# Patient Record
Sex: Female | Born: 1995
Health system: Southern US, Community
[De-identification: ages and names within clinical notes are randomized; demographics above are authoritative.]

## PROBLEM LIST (undated history)

## (undated) DIAGNOSIS — F32A Depression, unspecified: Secondary | ICD-10-CM

## (undated) DIAGNOSIS — G2581 Restless legs syndrome: Secondary | ICD-10-CM

## (undated) DIAGNOSIS — F419 Anxiety disorder, unspecified: Secondary | ICD-10-CM

## (undated) DIAGNOSIS — N200 Calculus of kidney: Secondary | ICD-10-CM

## (undated) HISTORY — PX: NO PAST SURGERIES: SHX2092

---

## 2019-03-17 ENCOUNTER — Other Ambulatory Visit: Payer: Self-pay

## 2019-03-17 ENCOUNTER — Emergency Department (HOSPITAL_COMMUNITY)
Admission: EM | Admit: 2019-03-17 | Discharge: 2019-03-18 | Disposition: A | Discharge status: Home / Self Care | Payer: Medicaid - Out of State | Attending: Emergency Medicine | Admitting: Emergency Medicine

## 2019-03-17 ENCOUNTER — Encounter (HOSPITAL_COMMUNITY): Payer: Self-pay | Admitting: *Deleted

## 2019-03-17 DIAGNOSIS — R109 Unspecified abdominal pain: Secondary | ICD-10-CM | POA: Diagnosis present

## 2019-03-17 DIAGNOSIS — N201 Calculus of ureter: Secondary | ICD-10-CM | POA: Diagnosis not present

## 2019-03-17 DIAGNOSIS — F17210 Nicotine dependence, cigarettes, uncomplicated: Secondary | ICD-10-CM | POA: Diagnosis not present

## 2019-03-17 HISTORY — DX: Calculus of kidney: N20.0

## 2019-03-17 NOTE — ED Triage Notes (Signed)
Pt c/o flank pain radiating into L groin; hx of kidney stones, reports pain feels similar

## 2019-03-18 ENCOUNTER — Emergency Department (HOSPITAL_COMMUNITY): Payer: Medicaid - Out of State

## 2019-03-18 LAB — URINALYSIS, ROUTINE W REFLEX MICROSCOPIC
Glucose, UA: NEGATIVE mg/dL
Ketones, ur: 40 mg/dL — AB
Leukocytes,Ua: NEGATIVE
Nitrite: NEGATIVE
Protein, ur: 30 mg/dL — AB
Specific Gravity, Urine: >1.03 — ABNORMAL HIGH (ref 1.005–1.030)
pH: 5.5 (ref 5.0–8.0)

## 2019-03-18 LAB — URINALYSIS, MICROSCOPIC (REFLEX)

## 2019-03-18 LAB — CBC WITH DIFFERENTIAL/PLATELET
Abs Immature Granulocytes: 0.02 10*3/uL (ref 0.00–0.07)
Basophils Absolute: 0.1 10*3/uL (ref 0.0–0.1)
Basophils Relative: 1 %
Eosinophils Absolute: 0.1 10*3/uL (ref 0.0–0.5)
Eosinophils Relative: 1 %
HCT: 38.4 % (ref 36.0–46.0)
Hemoglobin: 12.7 g/dL (ref 12.0–15.0)
Immature Granulocytes: 0 %
Lymphocytes Relative: 22 %
Lymphs Abs: 2.7 10*3/uL (ref 0.7–4.0)
MCH: 29.1 pg (ref 26.0–34.0)
MCHC: 33.1 g/dL (ref 30.0–36.0)
MCV: 87.9 fL (ref 80.0–100.0)
Monocytes Absolute: 1.1 10*3/uL — ABNORMAL HIGH (ref 0.1–1.0)
Monocytes Relative: 9 %
Neutro Abs: 8.4 10*3/uL — ABNORMAL HIGH (ref 1.7–7.7)
Neutrophils Relative %: 67 %
Platelets: 338 10*3/uL (ref 150–400)
RBC: 4.37 MIL/uL (ref 3.87–5.11)
RDW: 12.4 % (ref 11.5–15.5)
WBC: 12.3 10*3/uL — ABNORMAL HIGH (ref 4.0–10.5)
nRBC: 0 % (ref 0.0–0.2)

## 2019-03-18 LAB — BASIC METABOLIC PANEL
Anion gap: 10 (ref 5–15)
BUN: 5 mg/dL — ABNORMAL LOW (ref 6–20)
CO2: 22 mmol/L (ref 22–32)
Calcium: 9 mg/dL (ref 8.9–10.3)
Chloride: 105 mmol/L (ref 98–111)
Creatinine, Ser: 0.91 mg/dL (ref 0.44–1.00)
GFR calc Af Amer: >60 mL/min (ref >60)
GFR calc non Af Amer: >60 mL/min (ref >60)
Glucose, Bld: 97 mg/dL (ref 70–99)
Potassium: 3.4 mmol/L — ABNORMAL LOW (ref 3.5–5.1)
Sodium: 137 mmol/L (ref 135–145)

## 2019-03-18 LAB — I-STAT BETA HCG BLOOD, ED (MC, WL, AP ONLY): I-stat hCG, quantitative: <5 m[IU]/mL (ref <5)

## 2019-03-18 MED ORDER — KETOROLAC TROMETHAMINE 30 MG/ML IJ SOLN
30.0000 mg | Freq: Once | INTRAMUSCULAR | Status: AC
  Administered 2019-03-18: 01:00:00 30 mg via INTRAVENOUS
  Filled 2019-03-18: qty 1

## 2019-03-18 MED ORDER — HYDROMORPHONE HCL 1 MG/ML IJ SOLN
1.0000 mg | Freq: Once | INTRAMUSCULAR | Status: AC
  Administered 2019-03-18: 01:00:00 1 mg via INTRAVENOUS
  Filled 2019-03-18: qty 1

## 2019-03-18 MED ORDER — HYDROMORPHONE HCL 1 MG/ML IJ SOLN
1.0000 mg | Freq: Once | INTRAMUSCULAR | Status: AC
  Administered 2019-03-18: 1 mg via INTRAVENOUS
  Filled 2019-03-18: qty 1

## 2019-03-18 MED ORDER — ONDANSETRON 4 MG PO TBDP: 4.0000 mg | ORAL_TABLET | Freq: Three times a day (TID) | ORAL | 0 refills | Status: DC | PRN

## 2019-03-18 MED ORDER — ONDANSETRON HCL 4 MG/2ML IJ SOLN
4.0000 mg | Freq: Once | INTRAMUSCULAR | Status: AC
  Administered 2019-03-18: 4 mg via INTRAVENOUS
  Filled 2019-03-18: qty 2

## 2019-03-18 MED ORDER — HYDROCODONE-ACETAMINOPHEN 5-325 MG PO TABS: 1.0000 | ORAL_TABLET | ORAL | 0 refills | Status: DC | PRN

## 2019-03-18 NOTE — ED Provider Notes (Signed)
[facility] Provider Note   CSN: [id number] Arrival date & time: 03/17/19  2328     History   Chief Complaint Chief Complaint  Patient presents with  . Nephrolithiasis    HPI Rebecca Singleton is a 23 y.o. female.     The history is provided by the patient and medical records.     23 y.o. female with history of kidney stones, presenting to the ED with left flank pain.  States onset today around 7 PM.  Pain localized to left lateral abdomen with some radiation to the groin.  She does report difficulty urinating and "pressure" when doing so.  She denies any dysuria or hematuria.  No pelvic pain or vaginal discharge.  No fever or chills.  She has had some nausea but denies vomiting.  Reports history of kidney stones in the past, have always been on her left side.  She has required lithotripsy in the past but other stones have passed spontaneously.  She did try taking 800 mg Motrin at home without relief.  [place] a urologist there that she sees regularly.  Past Medical History:  Diagnosis Date  . Kidney stones     There are no active problems to display for this patient.   History reviewed. No pertinent surgical history.   OB History   No obstetric history on file.      Home Medications    Prior to Admission medications   Not on File    Family History No family history on file.  Social History Social History   Tobacco Use  . Smoking status: Current Every Day Smoker  . Smokeless tobacco: Never Used  Substance Use Topics  . Alcohol use: Never    Frequency: Never  . Drug use: Never     Allergies   Latex and Morphine and related   Review of Systems Review of Systems  Gastrointestinal: Positive for nausea.  Genitourinary: Positive for flank pain.  All other systems reviewed and are negative.    Physical Exam Updated Vital Signs BP 140/88 (BP Location: Right Arm)    Pulse 99   Temp 97.9 F (36.6 C) (Oral)   Resp 18   LMP 03/07/2019   SpO2 98%   Physical Exam Vitals signs and nursing note reviewed.  Constitutional:      Appearance: She is well-developed.  HENT:     Head: Normocephalic and atraumatic.  Eyes:     Conjunctiva/sclera: Conjunctivae normal.     Pupils: Pupils are equal, round, and reactive to light.  Neck:     Musculoskeletal: Normal range of motion.  Cardiovascular:     Rate and Rhythm: Normal rate and regular rhythm.     Heart sounds: Normal heart sounds.  Pulmonary:     Effort: Pulmonary effort is normal.     Breath sounds: Normal breath sounds.  Abdominal:     General: Bowel sounds are normal.     Palpations: Abdomen is soft.     Tenderness: There is abdominal tenderness. There is no guarding.    Musculoskeletal: Normal range of motion.  Skin:    General: Skin is warm and dry.  Neurological:     Mental Status: She is alert and oriented to person, place, and time.      ED Treatments / Results  Labs (all labs ordered are listed, but only abnormal results are displayed) Labs Reviewed  CBC WITH DIFFERENTIAL/PLATELET - Abnormal; Notable  for the following components:      Result Value   WBC 12.3 (*)    Neutro Abs 8.4 (*)    Monocytes Absolute 1.1 (*)    All other components within normal limits  BASIC METABOLIC PANEL - Abnormal; Notable for the following components:   Potassium 3.4 (*)    BUN 5 (*)    All other components within normal limits  URINALYSIS, ROUTINE W REFLEX MICROSCOPIC - Abnormal; Notable for the following components:   Specific Gravity, Urine >1.030 (*)    Hgb urine dipstick SMALL (*)    Bilirubin Urine SMALL (*)    Ketones, ur 40 (*)    Protein, ur 30 (*)    All other components within normal limits  URINALYSIS, MICROSCOPIC (REFLEX) - Abnormal; Notable for the following components:   Bacteria, UA FEW (*)    All other components within normal limits  I-STAT BETA HCG BLOOD, ED ([facility], [facility], [facility]  ONLY)    EKG    Radiology Ct Renal Stone Study  Result Date: 03/18/2019 CLINICAL DATA:  Left flank pain EXAM: CT ABDOMEN AND PELVIS WITHOUT CONTRAST TECHNIQUE: Multidetector CT imaging of the abdomen and pelvis was performed following the standard protocol without IV contrast. COMPARISON:  None. FINDINGS: Lower chest: Lung bases are clear. No effusions. Heart is normal size. Hepatobiliary: No focal hepatic abnormality. Gallbladder unremarkable. Pancreas: No focal abnormality or ductal dilatation. Spleen: No focal abnormality.  Normal size. Adrenals/Urinary Tract: There is mild left hydronephrosis. Punctate 1 mm left UVJ stone present. Adrenal glands and urinary bladder unremarkable. Stomach/Bowel: Normal appendix. Stomach, large and small bowel grossly unremarkable. Vascular/Lymphatic: No evidence of aneurysm or adenopathy. Reproductive: Uterus and adnexa unremarkable.  No mass. Other: No free fluid or free air. Musculoskeletal: No acute bony abnormality. IMPRESSION: Punctate 1 mm left UVJ stone with mild left hydronephrosis. Electronically Signed   By: Charlett NoseKevin  Dover M.D.   On: 03/18/2019 01:17    Procedures Procedures (including critical care time)  Medications Ordered in ED Medications  HYDROmorphone (DILAUDID) injection 1 mg (1 mg Intravenous Given 03/18/19 0042)  ondansetron (ZOFRAN) injection 4 mg (4 mg Intravenous Given 03/18/19 0042)  ketorolac (TORADOL) 30 MG/ML injection 30 mg (30 mg Intravenous Given 03/18/19 0042)  HYDROmorphone (DILAUDID) injection 1 mg (1 mg Intravenous Given 03/18/19 0210)     Initial Impression / Assessment and Plan / ED Course  I have reviewed the triage vital signs and the nursing notes.  Pertinent labs & imaging results that were available during my care of the patient were reviewed by me and considered in my medical decision making (see chart for details).  23 year old female here with left flank pain.  Has history of kidney stones and reports this feels  similar.  She is afebrile and nontoxic.  Has left lateral abdominal tenderness without peritoneal signs.  Screening labs are overall reassuring.  UA without signs of infection.  Renal study does reveal punctate 1 mm left UVJ stone with mild left hydronephrosis.  Pain was controlled here without issue.  Patient appears stable for discharge home with outpatient management.  Stone likely to pass in the next 24 to 48 hours.  [place], can follow-up with her urologist there.  Return here for any new or acute changes.  Final Clinical Impressions(s) / ED Diagnoses   Final diagnoses:  Left ureteral stone    ED Discharge Orders         Ordered  HYDROcodone-acetaminophen (NORCO/VICODIN) 5-325 MG tablet  Every 4 hours PRN     03/18/19 0307    ondansetron (ZOFRAN ODT) 4 MG disintegrating tablet  Every 8 hours PRN     03/18/19 0307           Larene Pickett, PA-C 03/18/19 0330    Orpah Greek, MD 03/18/19 [id number]

## 2019-03-18 NOTE — ED Notes (Signed)
Pt discharged from ED; instructions provided and scripts given; Pt encouraged to return to ED if symptoms worsen and to f/u with PCP; Pt verbalized understanding of all instructions 

## 2019-03-18 NOTE — Discharge Instructions (Signed)
Follow-up with your urologist once you return home to [facility]. Can use the prescribed medication to keep symptoms controlled.

## 2019-12-15 ENCOUNTER — Ambulatory Visit: Payer: Medicaid - Out of State | Admitting: Family Medicine

## 2019-12-23 ENCOUNTER — Ambulatory Visit: Payer: Medicaid - Out of State | Admitting: Primary Care

## 2020-03-18 ENCOUNTER — Encounter: Payer: Self-pay | Admitting: Emergency Medicine

## 2020-03-18 ENCOUNTER — Other Ambulatory Visit: Payer: Self-pay

## 2020-03-18 ENCOUNTER — Ambulatory Visit
Admission: EM | Admit: 2020-03-18 | Discharge: 2020-03-18 | Disposition: A | Discharge status: Home / Self Care | Payer: 59 | Attending: Emergency Medicine | Admitting: Emergency Medicine

## 2020-03-18 DIAGNOSIS — N390 Urinary tract infection, site not specified: Secondary | ICD-10-CM | POA: Diagnosis present

## 2020-03-18 DIAGNOSIS — N898 Other specified noninflammatory disorders of vagina: Secondary | ICD-10-CM | POA: Diagnosis present

## 2020-03-18 HISTORY — DX: Depression, unspecified: F32.A

## 2020-03-18 HISTORY — DX: Restless legs syndrome: G25.81

## 2020-03-18 HISTORY — DX: Anxiety disorder, unspecified: F41.9

## 2020-03-18 LAB — POCT URINALYSIS DIP (MANUAL ENTRY)
Bilirubin, UA: NEGATIVE
Glucose, UA: NEGATIVE mg/dL
Ketones, POC UA: NEGATIVE mg/dL
Nitrite, UA: POSITIVE — AB
Protein Ur, POC: NEGATIVE mg/dL
Spec Grav, UA: 1.025 (ref 1.010–1.025)
Urobilinogen, UA: 0.2 E.U./dL
pH, UA: 7 (ref 5.0–8.0)

## 2020-03-18 MED ORDER — FLUCONAZOLE 150 MG PO TABS: 150.0000 mg | ORAL_TABLET | Freq: Every day | ORAL | 0 refills | Status: DC

## 2020-03-18 MED ORDER — CEPHALEXIN 500 MG PO CAPS: 500.0000 mg | ORAL_CAPSULE | Freq: Two times a day (BID) | ORAL | 0 refills | Status: AC

## 2020-03-18 NOTE — ED Triage Notes (Signed)
Patient in today c/o urinary frequency and dysuria x 3-4 days. Patient does have vaginal discharge and itching. Patient has tried OTC yeast infection medication without relief.

## 2020-03-18 NOTE — ED Provider Notes (Signed)
Rebecca Singleton    CSN: [id number] Arrival date & time: 03/18/20  1651      History   Chief Complaint Chief Complaint  Patient presents with  . Urinary Frequency  . Dysuria    HPI Rebecca Singleton is a 24 y.o. female.   Patient presents with 3 to 4-day history of urinary frequency and dysuria.  She also reports thick white vaginal discharge and vaginal itching.  She denies fever, chills, abdominal pain, back pain, pelvic pain, rash, lesions, or other symptoms.  Treatment attempted at home with OTC vaginal yeast suppository.  She is sexually active in a monogamous relationship.    The history is provided by the patient.    Past Medical History:  Diagnosis Date  . Anxiety   . Depression   . Kidney stones   . Restless leg syndrome     There are no problems to display for this patient.   Past Surgical History:  Procedure Laterality Date  . NO PAST SURGERIES      OB History   No obstetric history on file.      Home Medications    Prior to Admission medications   Medication Sig Start Date End Date Taking? Authorizing Provider  pramipexole (MIRAPEX) 0.5 MG tablet Take 0.75 mg by mouth daily. 12/31/18  Yes [provider]  sertraline (ZOLOFT) 50 MG tablet Take 50 mg by mouth daily. 12/31/18  Yes [provider]  cephALEXin (KEFLEX) 500 MG capsule Take 1 capsule (500 mg total) by mouth 2 (two) times daily for 5 days. 03/18/20 03/23/20  Mickie Bail, NP  fluconazole (DIFLUCAN) 150 MG tablet Take 1 tablet (150 mg total) by mouth daily. Take one tablet today.  May repeat in 3 days. 03/18/20   Mickie Bail, NP  HYDROcodone-acetaminophen (NORCO/VICODIN) 5-325 MG tablet Take 1 tablet by mouth every 4 (four) hours as needed. 03/18/19   Garlon Hatchet, PA-C  omeprazole (PRILOSEC) 20 MG capsule Take 20 mg by mouth daily. 01/08/19   [provider]  ondansetron (ZOFRAN ODT) 4 MG disintegrating tablet Take 1 tablet (4 mg total) by mouth every 8  (eight) hours as needed for nausea. 03/18/19   Garlon Hatchet, PA-C    Family History Family History  Problem Relation Age of Onset  . Fibromyalgia Mother   . Iron deficiency Mother   . Healthy Father     Social History Social History   Tobacco Use  . Smoking status: Former Smoker    Packs/day: 1.00    Years: 6.00    Pack years: 6.00    Types: Cigarettes    Quit date: 03/18/2016    Years since quitting: 4.0  . Smokeless tobacco: Never Used  Vaping Use  . Vaping Use: Every day  . Substances: Nicotine, Flavoring  Substance Use Topics  . Alcohol use: Yes    Comment: rarely  . Drug use: Never     Allergies   Morphine, Morphine and related, and Latex   Review of Systems Review of Systems  Constitutional: Negative for chills and fever.  HENT: Negative for ear pain and sore throat.   Eyes: Negative for pain and visual disturbance.  Respiratory: Negative for cough and shortness of breath.   Cardiovascular: Negative for chest pain and palpitations.  Gastrointestinal: Negative for abdominal pain, nausea and vomiting.  Genitourinary: Positive for dysuria and vaginal discharge. Negative for flank pain, hematuria and pelvic pain.  Musculoskeletal: Negative for arthralgias and back pain.  Skin: Negative for color change and rash.  Neurological: Negative for seizures and syncope.  All other systems reviewed and are negative.    Physical Exam Triage Vital Signs ED Triage Vitals  Enc Vitals Group     BP      Pulse      Resp      Temp      Temp src      SpO2      Weight      Height      Head Circumference      Peak Flow      Pain Score      Pain Loc      Pain Edu?      Excl. in [place]?    No data found.  Updated Vital Signs BP 108/73 (BP Location: Left Arm)   Pulse 86   Temp 98.6 F (37 C) (Oral)   Resp 18   Ht 5\' 6"  (1.676 m)   Wt 220 lb (99.8 kg)   LMP 03/05/2020 (Exact Date)   SpO2 97%   BMI 35.51 kg/m   Visual Acuity Right Eye Distance:   Left Eye  Distance:   Bilateral Distance:    Right Eye Near:   Left Eye Near:    Bilateral Near:     Physical Exam Vitals and nursing note reviewed.  Constitutional:      General: She is not in acute distress.    Appearance: She is well-developed.  HENT:     Head: Normocephalic and atraumatic.     Mouth/Throat:     Mouth: Mucous membranes are moist.  Eyes:     Conjunctiva/sclera: Conjunctivae normal.  Cardiovascular:     Rate and Rhythm: Normal rate and regular rhythm.     Heart sounds: No murmur heard.   Pulmonary:     Effort: Pulmonary effort is normal. No respiratory distress.     Breath sounds: Normal breath sounds.  Abdominal:     Palpations: Abdomen is soft.     Tenderness: There is no abdominal tenderness. There is no right CVA tenderness, left CVA tenderness, guarding or rebound.  Musculoskeletal:     Cervical back: Neck supple.  Skin:    General: Skin is warm and dry.     Findings: No rash.  Neurological:     General: No focal deficit present.     Mental Status: She is alert and oriented to person, place, and time.     Gait: Gait normal.  Psychiatric:        Mood and Affect: Mood normal.        Behavior: Behavior normal.      UC Treatments / Results  Labs (all labs ordered are listed, but only abnormal results are displayed) Labs Reviewed  POCT URINALYSIS DIP (MANUAL ENTRY) - Abnormal; Notable for the following components:      Result Value   Clarity, UA cloudy (*)    Blood, UA small (*)    Nitrite, UA Positive (*)    Leukocytes, UA Moderate (2+) (*)    All other components within normal limits  URINE CULTURE  CERVICOVAGINAL ANCILLARY ONLY    EKG   Radiology No results found.  Procedures Procedures (including critical care time)  Medications Ordered in UC Medications - No data to display  Initial Impression / Assessment and Plan / UC Course  I have reviewed the triage vital signs and the nursing notes.  Pertinent labs & imaging results that  were available  during my care of the patient were reviewed by me and considered in my medical decision making (see chart for details).   UTI and vaginal discharge.  Urine culture pending.  Patient obtained vaginal self swab for STD testing.  Low suspicion for STDs; patient is in a long-term monogamous relationship and her symptoms indicate a candidal infection.  Instructed patient to abstain from sexual activity until her test results are back.  Discussed with patient that she may require additional treatment if her test results are positive.  Instructed her to follow-up with her PCP if her symptoms are not improving.  Patient agrees to plan of care.     Final Clinical Impressions(s) / UC Diagnoses   Final diagnoses:  Urinary tract infection without hematuria, site unspecified  Vaginal discharge     Discharge Instructions     Take the Keflex and Diflucan as directed.    Your urine shows signs of infection.  A urine culture is pending.  We will call you if your antibiotic needs to be changed.    Your vaginal tests are pending.  Do not have sexual activity until the test results are back.  You may require additional treatment at that time.    Follow up with your primary care provider if your symptoms are not improving.       ED Prescriptions    Medication Sig Dispense Auth. Provider   cephALEXin (KEFLEX) 500 MG capsule Take 1 capsule (500 mg total) by mouth 2 (two) times daily for 5 days. 10 capsule Mickie Bail, NP   fluconazole (DIFLUCAN) 150 MG tablet Take 1 tablet (150 mg total) by mouth daily. Take one tablet today.  May repeat in 3 days. 2 tablet Mickie Bail, NP     I have reviewed the PDMP during this encounter.   Mickie Bail, NP 03/18/20 1731

## 2020-03-18 NOTE — Discharge Instructions (Signed)
Take the Keflex and Diflucan as directed.    Your urine shows signs of infection.  A urine culture is pending.  We will call you if your antibiotic needs to be changed.    Your vaginal tests are pending.  Do not have sexual activity until the test results are back.  You may require additional treatment at that time.    Follow up with your primary care provider if your symptoms are not improving.

## 2020-03-21 LAB — URINE CULTURE: Culture: >=100000 — AB

## 2020-03-22 LAB — CERVICOVAGINAL ANCILLARY ONLY
Bacterial Vaginitis (gardnerella): POSITIVE — AB
Candida Glabrata: NEGATIVE
Candida Vaginitis: NEGATIVE
Chlamydia: NEGATIVE
Comment: NEGATIVE
Comment: NEGATIVE
Comment: NEGATIVE
Comment: NEGATIVE
Comment: NEGATIVE
Comment: NORMAL
Neisseria Gonorrhea: NEGATIVE
Trichomonas: NEGATIVE

## 2020-03-23 ENCOUNTER — Telehealth (HOSPITAL_COMMUNITY): Payer: Self-pay | Admitting: Orthopedic Surgery

## 2020-03-23 MED ORDER — METRONIDAZOLE 500 MG PO TABS: 500.0000 mg | ORAL_TABLET | Freq: Two times a day (BID) | ORAL | 0 refills | Status: DC

## 2020-03-25 MED ORDER — METRONIDAZOLE 500 MG PO TABS: 500.0000 mg | ORAL_TABLET | Freq: Two times a day (BID) | ORAL | 0 refills | Status: DC

## 2020-03-25 NOTE — Addendum Note (Signed)
Addended by: Latanya Presser on: 03/25/2020 11:26 AM   Modules accepted: Orders

## 2020-03-25 NOTE — Telephone Encounter (Signed)
Pt called asking about her rx. It was sent to a pharmacy in [place]. Pt needed it sent to [facility]. Apologized for the inconvenience and informed pt it would be sent to correct pharmacy.

## 2020-04-12 ENCOUNTER — Ambulatory Visit (INDEPENDENT_AMBULATORY_CARE_PROVIDER_SITE_OTHER): Payer: 59 | Admitting: Family Medicine

## 2020-04-12 ENCOUNTER — Encounter: Payer: Self-pay | Admitting: Family Medicine

## 2020-04-12 ENCOUNTER — Other Ambulatory Visit: Payer: Self-pay

## 2020-04-12 VITALS — BP 127/81 | HR 71 | Wt 241.6 lb

## 2020-04-12 DIAGNOSIS — F3341 Major depressive disorder, recurrent, in partial remission: Secondary | ICD-10-CM

## 2020-04-12 DIAGNOSIS — N921 Excessive and frequent menstruation with irregular cycle: Secondary | ICD-10-CM

## 2020-04-12 DIAGNOSIS — K219 Gastro-esophageal reflux disease without esophagitis: Secondary | ICD-10-CM | POA: Diagnosis not present

## 2020-04-12 DIAGNOSIS — N946 Dysmenorrhea, unspecified: Secondary | ICD-10-CM | POA: Insufficient documentation

## 2020-04-12 DIAGNOSIS — N920 Excessive and frequent menstruation with regular cycle: Secondary | ICD-10-CM | POA: Insufficient documentation

## 2020-04-12 MED ORDER — NORGESTIMATE-ETH ESTRADIOL 0.25-35 MG-MCG PO TABS: 1.0000 | ORAL_TABLET | Freq: Every day | ORAL | 6 refills | Status: DC

## 2020-04-12 NOTE — Assessment & Plan Note (Signed)
Likely becoming irregular based on weight gain. Will try OCs with skipping placebo for now.

## 2020-04-12 NOTE — Patient Instructions (Signed)
Dysmenorrhea  Dysmenorrhea refers to cramps caused by the muscles of the uterus tightening (contracting) during a menstrual period. Dysmenorrhea may be mild, or it may be severe enough to interfere with everyday activities for a few days each month. Primary dysmenorrhea is menstrual cramps that last a couple of days when you start having menstrual periods or soon after. This often begins after a teenager starts having her period. As a woman gets older or has a baby, the cramps will usually lessen or disappear. Secondary dysmenorrhea begins later in life and is caused by a disorder in the reproductive system. It lasts longer, and it may cause more pain than primary dysmenorrhea. The pain may start before the period and last a few days after the period. What are the causes? Dysmenorrhea is usually caused by an underlying problem, such as:  The tissue that lines the uterus (endometrium) growing outside of the uterus in other areas of the body (endometriosis).  Endometrial tissue growing into the muscular walls of the uterus (adenomyosis).  Blood vessels in the pelvis becoming filled with blood just before the menstrual period (pelvic congestive syndrome).  Overgrowth of cells (polyps) in the endometrium or the lower part of the uterus (cervix).  The uterus dropping down into the vagina (prolapse) due to stretched or weak muscles.  Bladder problems, such as infection or inflammation.  Intestinal problems, such as a tumor or irritable bowel syndrome.  Cancer of the reproductive organs or bladder.  A severely tipped uterus.  A cervix that is closed or has a very small opening.  Noncancerous (benign) tumors of the uterus (fibroids).  Pelvic inflammatory disease (PID).  Pelvic scarring (adhesions) from a previous surgery.  An ovarian cyst.  An IUD (intrauterine device). What increases the risk? You are more likely to develop this condition if:  You are younger than age 67.  You  started puberty early.  You have irregular or heavy bleeding.  You have never given birth.  You have a family history of dysmenorrhea.  You smoke. What are the signs or symptoms? Symptoms of this condition include:  Cramping, throbbing pain, or a feeling of fullness in the lower abdomen.  Lower back pain.  Periods lasting for longer than 7 days.  Headaches.  Bloating.  Fatigue.  Nausea or vomiting.  Diarrhea.  Sweating or dizziness.  Loose stools. How is this diagnosed? This condition may be diagnosed based on:  Your symptoms.  Your medical history.  A physical exam.  Blood tests.  A Pap test. This is a test in which cells from the cervix are tested for signs of cancer or infection.  A pregnancy test.  Imaging tests, such as: ? Ultrasound. ? A procedure to remove and examine a sample of endometrial tissue (dilation and curettage, D&C). ? A procedure to visually examine the inside of:  The uterus (hysteroscopy).  The abdomen or pelvis (laparoscopy).  The bladder (cystoscopy).  The intestine (colonoscopy).  The stomach (gastroscopy). ? X-rays. ? CT scan. ? MRI. How is this treated? Treatment depends on the cause of the dysmenorrhea. Treatment may include:  Pain medicine prescribed by your health care provider.  Birth control pills that contain the hormone progesterone.  An IUD that contains the hormone progesterone.  Medicines to control bleeding.  Hormone replacement therapy.  NSAIDs. These may help to stop the production of hormones that cause cramps.  Antidepressant medicines.  Surgery to remove adhesions, endometriosis, ovarian cysts, fibroids, or the entire uterus (hysterectomy).  Injections of progesterone  to stop the menstrual period.  A procedure to destroy the endometrium (endometrial ablation).  A procedure to cut the nerves in the bottom of the spine (sacrum) that go to the reproductive organs (presacral neurectomy).  A  procedure to apply an electric current to nerves in the sacrum (sacral nerve stimulation).  Exercise and physical therapy.  Meditation and yoga therapy.  Acupuncture. Work with your health care provider to determine what treatment or combination of treatments is best for you. Follow these instructions at home: Relieving pain and cramping  Apply heat to your lower back or abdomen when you experience pain or cramps. Use the heat source that your health care provider recommends, such as a moist heat pack or a heating pad. ? Place a towel between your skin and the heat source. ? Leave the heat on for 20-30 minutes. ? Remove the heat if your skin turns bright red. This is especially important if you are unable to feel pain, heat, or cold. You may have a greater risk of getting burned. ? Do not sleep with a heating pad on.  Do aerobic exercises, such as walking, swimming, or biking. This can help to relieve cramps.  Massage your lower back or abdomen to help relieve pain. General instructions  Take over-the-counter and prescription medicines only as told by your health care provider.  Do not drive or use heavy machinery while taking prescription pain medicine.  Avoid alcohol and caffeine during and right before your menstrual period. These can make cramps worse.  Do not use any products that contain nicotine or tobacco, such as cigarettes and e-cigarettes. If you need help quitting, ask your health care provider.  Keep all follow-up visits as told by your health care provider. This is important. Contact a health care provider if:  You have pain that gets worse or does not get better with medicine.  You have pain with sex.  You develop nausea or vomiting with your period that is not controlled with medicine. Get help right away if:  You faint. Summary  Dysmenorrhea refers to cramps caused by the muscles of the uterus tightening (contracting) during a menstrual  period.  Dysmenorrhea may be mild, or it may be severe enough to interfere with everyday activities for a few days each month.  Treatment depends on the cause of the dysmenorrhea.  Work with your health care provider to determine what treatment or combination of treatments is best for you. This information is not intended to replace advice given to you by your health care provider. Make sure you discuss any questions you have with your health care provider. Document Revised: 08/31/2017 Document Reviewed: 10/21/2016 Elsevier Patient Education  2020 [employer]  

## 2020-04-12 NOTE — Progress Notes (Signed)
Subjective:    Patient ID: Rebecca Singleton is a 24 y.o. female presenting with No chief complaint on file.  on 04/12/2020  HPI: Moved here to be with new partner from [place]. Oldest of 7. Youngest sibling in 15 yo. On Depo 2012-2013 previously and did not like it. Also with IUD,but caused on-going bleeding. Cycles are always very heavy, a lot of bleeding, has had to miss school and work due to pain. Usually regular and recently it is going longer. 20 lb weight gain in last 1 year. Saw OB/GYN and had hormones checked and were normal. Had external u/s only and told she might have small fibroids "too small to see". With her previous wife, attempted at home insemination, in hopes of pursuing fertility with ovulation prediction kits on several occasions and dd not achieve pregnancy.  Review of Systems  Constitutional: Negative for chills and fever.  Respiratory: Negative for shortness of breath.   Cardiovascular: Negative for chest pain.  Gastrointestinal: Negative for abdominal pain, nausea and vomiting.  Genitourinary: Negative for dysuria.  Skin: Negative for rash.      Objective:    BP 127/81    Pulse 71    Wt 241 lb 9.6 oz (109.6 kg)    LMP 03/05/2020    BMI 39.00 kg/m  Physical Exam Exam conducted with a chaperone present.  Constitutional:      General: She is not in acute distress.    Appearance: She is well-developed.  HENT:     Head: Normocephalic and atraumatic.  Eyes:     General: No scleral icterus. Cardiovascular:     Rate and Rhythm: Normal rate.  Pulmonary:     Effort: Pulmonary effort is normal.  Chest:     Breasts:        Right: No inverted nipple, mass or tenderness.        Left: Normal. No inverted nipple, mass or tenderness.  Abdominal:     Palpations: Abdomen is soft.  Genitourinary:    General: Normal vulva.     Comments: BUS normal, vagina is pink and rugated, cervix is nulliparous without lesion, uterus is small and anteverted,  no adnexal mass or tenderness.  Musculoskeletal:     Cervical back: Neck supple.  Skin:    General: Skin is warm and dry.  Neurological:     Mental Status: She is alert and oriented to person, place, and time.        Assessment & Plan:   Problem List Items Addressed This Visit      Unprioritized   Depression    On Zoloft      GERD (gastroesophageal reflux disease)    Continue Prilosec       Menorrhagia    Likely becoming irregular based on weight gain. Will try OCs with skipping placebo for now.      Dysmenorrhea - Primary    Begin continuous OC's for pain and cycle control. Discussed possibility of endometriosis and possible need for HSG and REI referral and possible IVF needs once ready to attempt pregnancy--not ready now. On PNV's.      Relevant Medications   norgestimate-ethinyl estradiol (ORTHO-CYCLEN) 0.25-35 MG-MCG tablet      Total time in review of prior notes, pathology, labs, history taking, review with patient, exam, note writing, discussion of options, plan for next steps, alternatives and risks of treatment: 32 minutes.  Return in about 1 year (around 04/12/2021) for in person, a CPE.  Shelbie Proctor  Shawnie Pons 04/12/2020 5:10 PM

## 2020-04-12 NOTE — Assessment & Plan Note (Signed)
On Zoloft. °

## 2020-04-12 NOTE — Assessment & Plan Note (Signed)
Continue Prilosec

## 2020-04-12 NOTE — Progress Notes (Signed)
Patient will like to discuss her heavy period. She reports cycles have always been abnormal. She reports last pap was in 2019 in [place].

## 2020-04-12 NOTE — Assessment & Plan Note (Signed)
Begin continuous OC's for pain and cycle control. Discussed possibility of endometriosis and possible need for HSG and REI referral and possible IVF needs once ready to attempt pregnancy--not ready now. On PNV's.

## 2020-05-01 IMAGING — CT CT RENAL STONE PROTOCOL
2 of 4 series · 17 of 46 positions shown, 19 images · non-contrast
Comparison: None.

CLINICAL DATA: Left flank pain

EXAM:
CT ABDOMEN AND PELVIS WITHOUT CONTRAST
TECHNIQUE: Multidetector CT imaging of the abdomen and pelvis was performed
following the standard protocol without IV contrast.

[Series 3: renal stone 5.0 · axial · 0.98mm/px · z∈[+120,+610]mm · 14 of 108 slices shown, 16 images]
[im 5/108  soft-tissue]
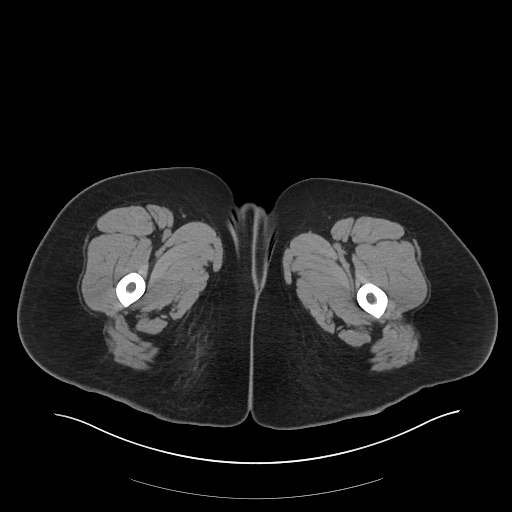
[im 5/108  bone]
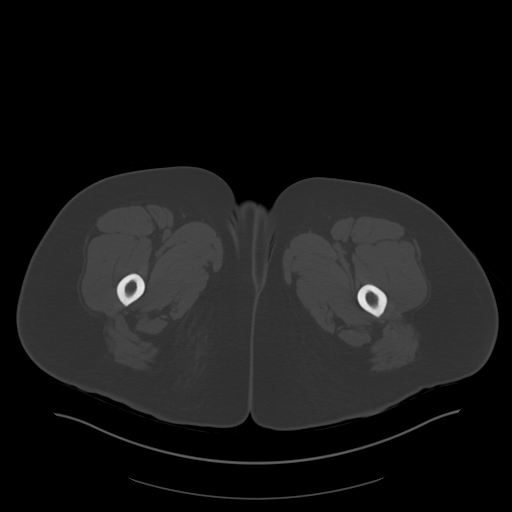
[im 13/108  soft-tissue]
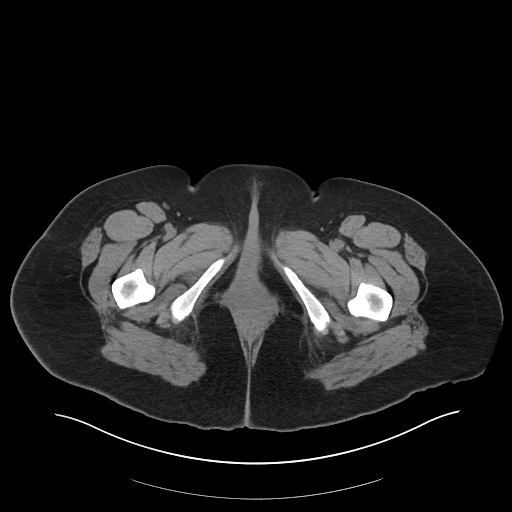
[im 22/108  soft-tissue]
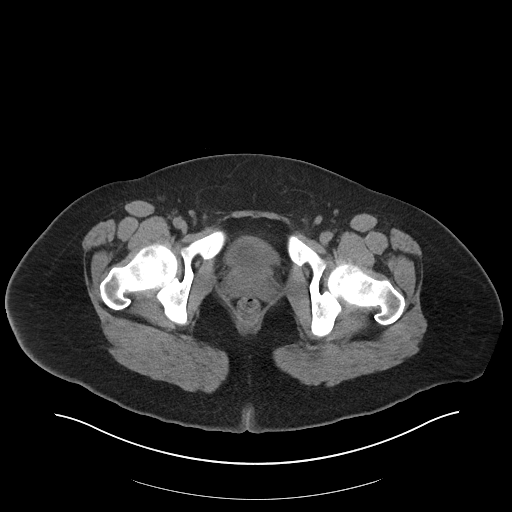
[im 30/108  soft-tissue]
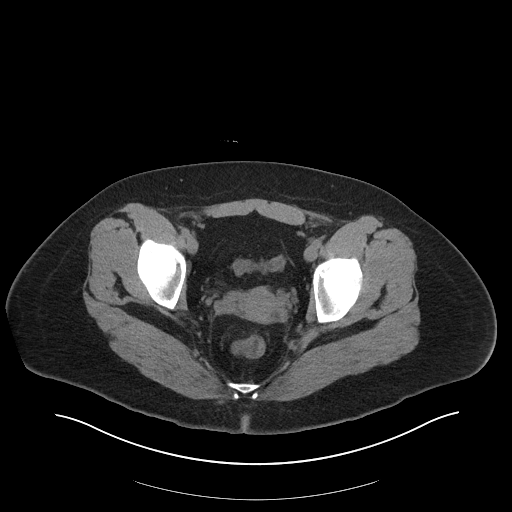
[im 35/108  soft-tissue]
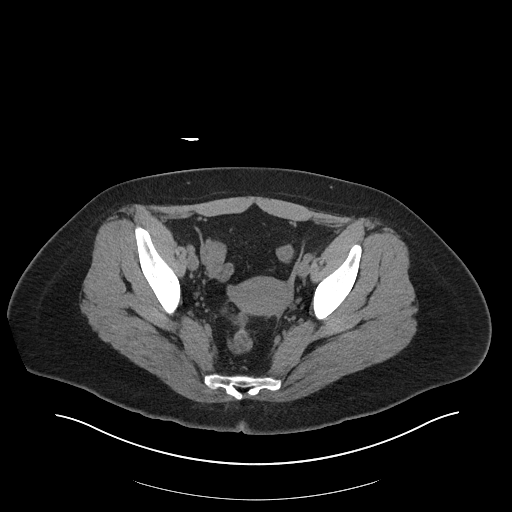
[im 43/108  soft-tissue]
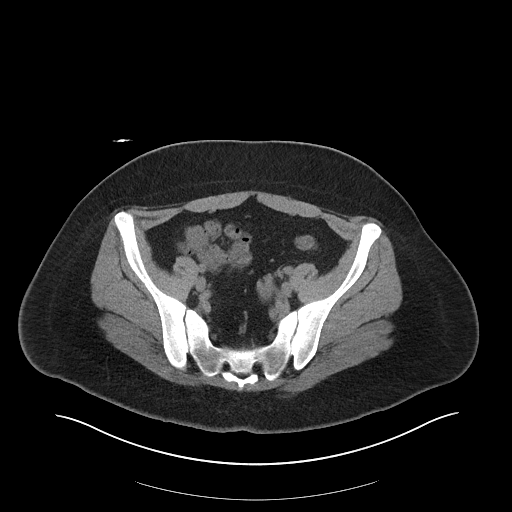
[im 52/108  soft-tissue]
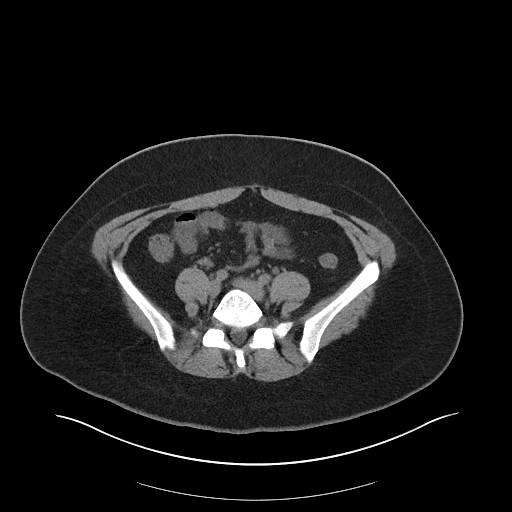
[im 56/108  soft-tissue]
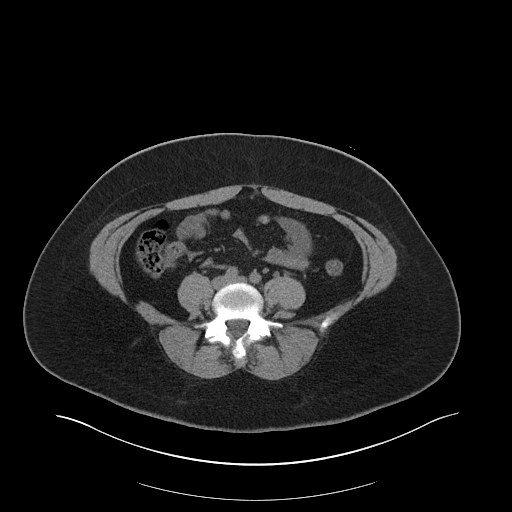
[im 65/108  soft-tissue]
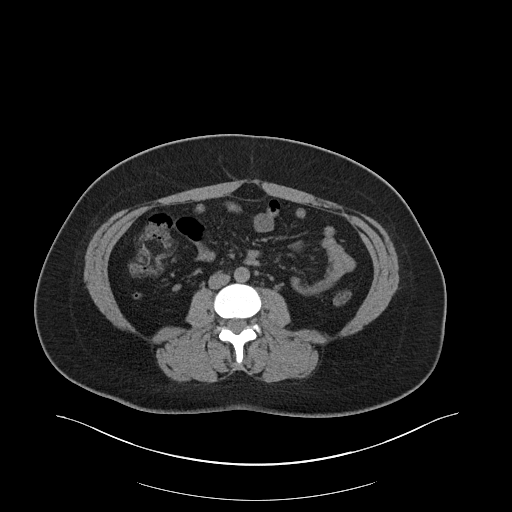
[im 65/108  bone]
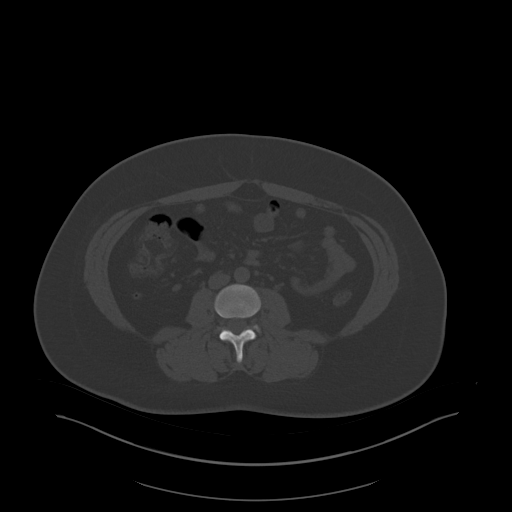
[im 73/108  soft-tissue]
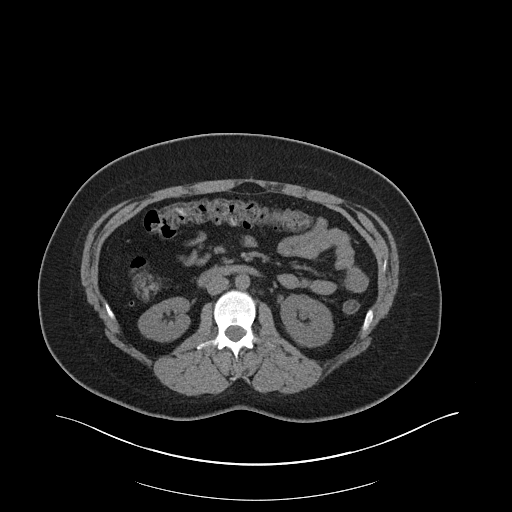
[im 82/108  soft-tissue]
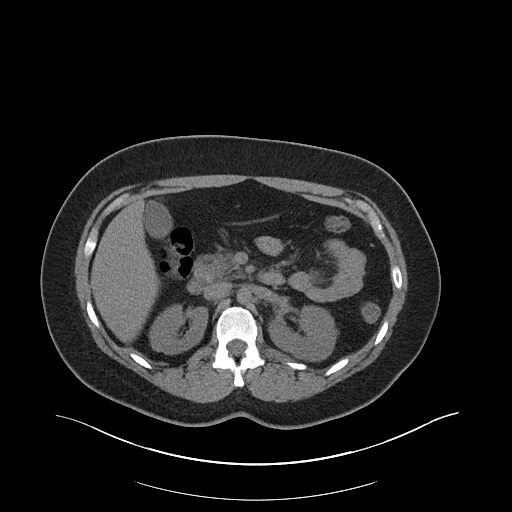
[im 86/108  soft-tissue]
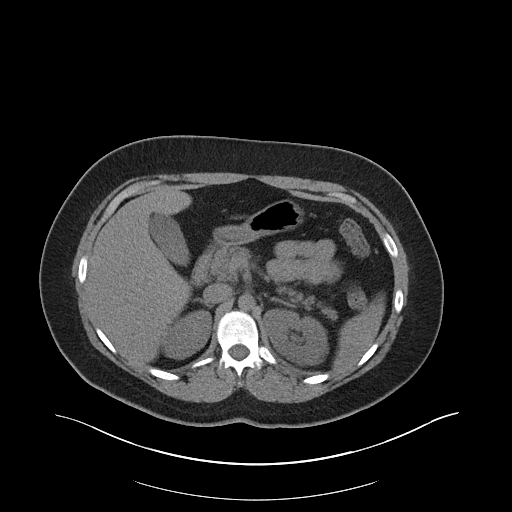
[im 95/108  soft-tissue]
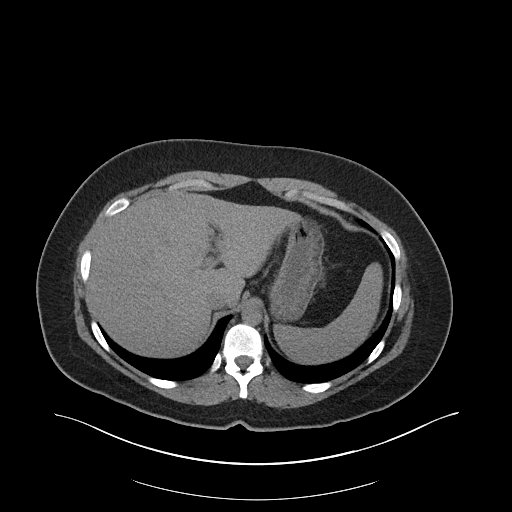
[im 103/108  soft-tissue]
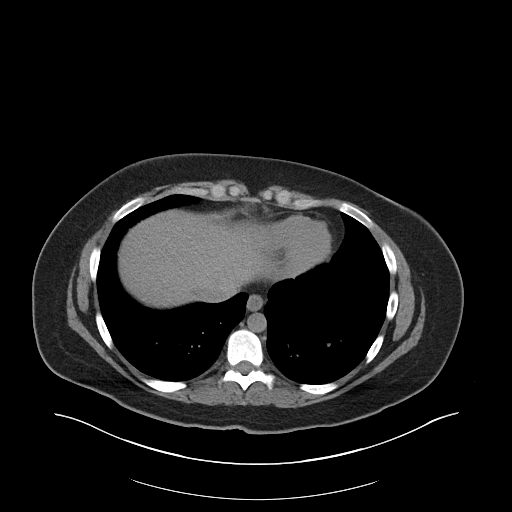

[Series 5: renal stone 3.0 cor · coronal · 0.92mm/px · 3 of 110 slices shown]
[im 37/110  soft-tissue]
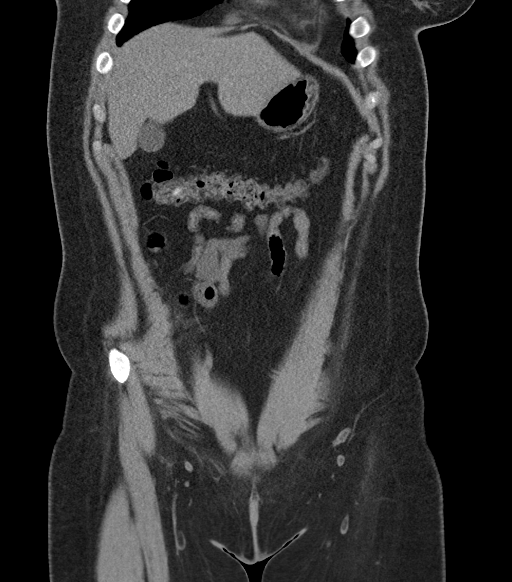
[im 49/110  soft-tissue]
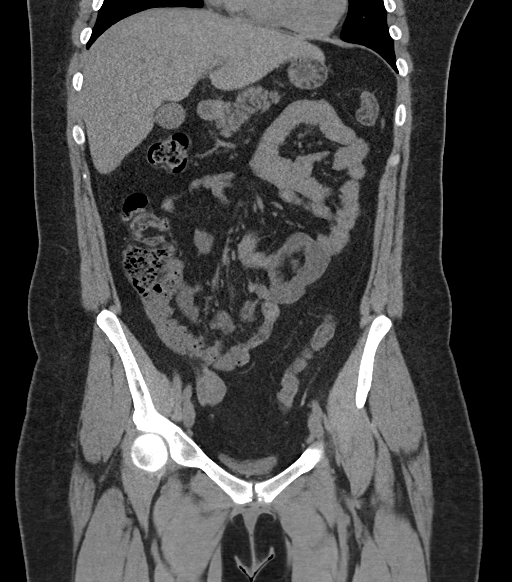
[im 61/110  soft-tissue]
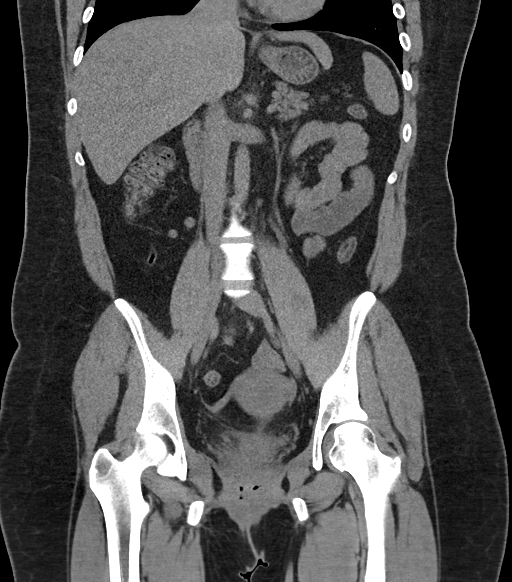

[17 of 46 positions shown; findings below may reference images not displayed]

FINDINGS: Lower chest: Lung bases are clear. No effusions. Heart is normal
size.

Hepatobiliary: No focal hepatic abnormality. Gallbladder
unremarkable.

Pancreas: No focal abnormality or ductal dilatation.

Spleen: No focal abnormality.  Normal size.

Adrenals/Urinary Tract: There is mild left hydronephrosis. Punctate
1 mm left UVJ stone present. Adrenal glands and urinary bladder
unremarkable.

Stomach/Bowel: Normal appendix. Stomach, large and small bowel
grossly unremarkable.

Vascular/Lymphatic: No evidence of aneurysm or adenopathy.

Reproductive: Uterus and adnexa unremarkable.  No mass.

Other: No free fluid or free air.

Musculoskeletal: No acute bony abnormality.
IMPRESSION: Punctate 1 mm left UVJ stone with mild left hydronephrosis.

## 2020-08-23 ENCOUNTER — Encounter: Payer: Self-pay | Admitting: *Deleted

## 2021-02-10 ENCOUNTER — Telehealth (INDEPENDENT_AMBULATORY_CARE_PROVIDER_SITE_OTHER): Payer: BC Managed Care – PPO | Admitting: Family Medicine

## 2021-02-10 DIAGNOSIS — N921 Excessive and frequent menstruation with irregular cycle: Secondary | ICD-10-CM

## 2021-02-10 MED ORDER — ORTHO-NOVUM 1/35 (28) 1-35 MG-MCG PO TABS: 1.0000 | ORAL_TABLET | Freq: Every day | ORAL | 5 refills | Status: DC

## 2021-02-10 NOTE — Progress Notes (Signed)
    GYNECOLOGY VIRTUAL VISIT ENCOUNTER NOTE  Provider location: Center for [facility]   Patient location: Home  I connected with Rebecca Singleton on 02/10/21 at  3:30 PM EDT by MyChart Video Encounter and verified that I am speaking with the correct person using two identifiers.   I discussed the limitations, risks, security and privacy concerns of performing an evaluation and management service virtually and the availability of in person appointments. I also discussed with the patient that there may be a patient responsible charge related to this service. The patient expressed understanding and agreed to proceed.   History:  Rebecca Singleton is a 25 y.o. G0P0000 female being evaluated today for problems with birth control. Previously on Depo and IUD which did not work for her. We started her on OCs and she was to skip placebos.reports things have been going well until late Feb and March and bleeding continued into April. Also having cramping and bloating. Still having cycles and spotting and cycles are heavy. She was skipping placebos. Under more stress. She denies any abnormal vaginal discharge, bleeding, pelvic pain or other concerns.       Past Medical History:  Diagnosis Date  . Anxiety   . Depression   . Kidney stones   . Restless leg syndrome    Past Surgical History:  Procedure Laterality Date  . NO PAST SURGERIES     The following portions of the patient's history were reviewed and updated as appropriate: allergies, current medications, past family history, past medical history, past social history, past surgical history and problem list.      Review of Systems:  Pertinent items noted in HPI and remainder of comprehensive ROS otherwise negative.  Physical Exam:   General:  Alert, oriented and cooperative. Patient appears to be in no acute distress.  Mental Status: Normal mood and affect. Normal behavior. Normal judgment and  thought content.   Respiratory: Normal respiratory effort, no problems with respiration noted  Rest of physical exam deferred due to type of encounter  Labs and Imaging No results found for this or any previous visit (from the past 336 hour(s)). No results found.     Assessment and Plan:     Problem List Items Addressed This Visit      Unprioritized   Menorrhagia - Primary    ? Change in level of hormone--change to 35 mcg pill. Take 1 week off and then resume with skipping placebos, consider stopping this and taking placebos if runs into a similar problem with this pill.      Relevant Medications   norethindrone-ethinyl estradiol 1/35 (ORTHO-NOVUM 1/35, 28,) tablet           I discussed the assessment and treatment plan with the patient. The patient was provided an opportunity to ask questions and all were answered. The patient agreed with the plan and demonstrated an understanding of the instructions.   The patient was advised to call back or seek an in-person evaluation/go to the ED if the symptoms worsen or if the condition fails to improve as anticipated.  I provided 13 minutes of face-to-face time during this encounter.   Reva Bores, MD Center for [employer], [facility]

## 2021-02-10 NOTE — Progress Notes (Signed)
Pt c/o of cramping and spotting for 4 weeks on birth control pill. Pt states she takes pill at the same time daily.

## 2021-02-10 NOTE — Assessment & Plan Note (Signed)
?   Change in level of hormone--change to 35 mcg pill. Take 1 week off and then resume with skipping placebos, consider stopping this and taking placebos if runs into a similar problem with this pill.

## 2021-03-23 ENCOUNTER — Telehealth: Payer: BC Managed Care – PPO | Admitting: Family Medicine

## 2021-04-24 ENCOUNTER — Other Ambulatory Visit: Payer: Self-pay | Admitting: Family Medicine

## 2021-04-24 DIAGNOSIS — N946 Dysmenorrhea, unspecified: Secondary | ICD-10-CM

## 2021-07-04 ENCOUNTER — Other Ambulatory Visit: Payer: Self-pay | Admitting: *Deleted

## 2021-07-04 DIAGNOSIS — N921 Excessive and frequent menstruation with irregular cycle: Secondary | ICD-10-CM

## 2021-07-04 MED ORDER — ORTHO-NOVUM 1/35 (28) 1-35 MG-MCG PO TABS: 1.0000 | ORAL_TABLET | Freq: Every day | ORAL | 5 refills | Status: DC

## 2022-01-02 ENCOUNTER — Telehealth: Payer: Self-pay

## 2022-01-02 NOTE — Telephone Encounter (Signed)
Pt called in stating she wants her prescription Alyces by Dr. Shawnie Pons to be sent to a pharmacy in [place]. I looked in pt medications and notified her that that medication was not listed and to call the pharmacy for refills.  ?

## 2022-07-13 ENCOUNTER — Other Ambulatory Visit: Payer: Self-pay | Admitting: Family Medicine

## 2022-07-13 DIAGNOSIS — N921 Excessive and frequent menstruation with irregular cycle: Secondary | ICD-10-CM

## 2022-07-17 ENCOUNTER — Other Ambulatory Visit: Payer: Self-pay | Admitting: *Deleted

## 2022-07-17 NOTE — Telephone Encounter (Signed)
Erroneous duplicate  

## 2022-08-31 ENCOUNTER — Ambulatory Visit: Payer: BC Managed Care – PPO | Admitting: Advanced Practice Midwife

## 2022-09-22 ENCOUNTER — Other Ambulatory Visit (HOSPITAL_COMMUNITY)
Admission: RE | Admit: 2022-09-22 | Discharge: 2022-09-22 | Disposition: A | Discharge status: Home / Self Care | Payer: BC Managed Care – PPO | Source: Ambulatory Visit | Attending: Family Medicine | Admitting: Family Medicine

## 2022-09-22 ENCOUNTER — Ambulatory Visit (INDEPENDENT_AMBULATORY_CARE_PROVIDER_SITE_OTHER): Payer: BC Managed Care – PPO | Admitting: Family Medicine

## 2022-09-22 ENCOUNTER — Encounter: Payer: Self-pay | Admitting: Family Medicine

## 2022-09-22 VITALS — BP 130/81 | HR 90 | Ht 67.0 in | Wt 266.0 lb

## 2022-09-22 DIAGNOSIS — Z01419 Encounter for gynecological examination (general) (routine) without abnormal findings: Secondary | ICD-10-CM

## 2022-09-22 DIAGNOSIS — N921 Excessive and frequent menstruation with irregular cycle: Secondary | ICD-10-CM | POA: Diagnosis not present

## 2022-09-22 MED ORDER — ALYACEN 1/35 1-35 MG-MCG PO TABS: ORAL_TABLET | ORAL | 4 refills | Status: DC

## 2022-09-22 NOTE — Progress Notes (Signed)
   GYNECOLOGY ANNUAL PREVENTATIVE CARE ENCOUNTER NOTE  Subjective:   Rebecca Singleton is a 26 y.o. G0P0000 female here for a routine annual gynecologic exam.  Current complaints: none, likes OCP and helps with dymenorrhea.      Denies abnormal vaginal bleeding, discharge, pelvic pain, problems with intercourse or other gynecologic concerns.    Gynecologic History No LMP recorded. (Menstrual status: Oral contraceptives). Contraception: OCP (estrogen/progesterone) Last Pap: needs today  Last mammogram: NA- has two family members with breast cancer (grandmother and great grand mother.   Health Maintenance Due  Topic Date Due   COVID-19 Vaccine (1) Never done   HIV Screening  Never done   Hepatitis C Screening  Never done   HPV VACCINES (2 - 3-dose series) 12/11/2013   DTaP/Tdap/Td (1 - Tdap) Never done   PAP-Cervical Cytology Screening  Never done   PAP SMEAR-Modifier  Never done   INFLUENZA VACCINE  Never done    The following portions of the patient's history were reviewed and updated as appropriate: allergies, current medications, past family history, past medical history, past social history, past surgical history and problem list.  Review of Systems Pertinent items are noted in HPI.   Objective:  BP 130/81   Pulse 90   Ht 5\' 7"  (1.702 m)   Wt 266 lb (120.7 kg)   BMI 41.66 kg/m  CONSTITUTIONAL: Well-developed, well-nourished female in no acute distress.  HENT:  Normocephalic, atraumatic, External right and left ear normal. Oropharynx is clear and moist EYES:  No scleral icterus.  NECK: Normal range of motion, supple, no masses.  Normal thyroid.  SKIN: Skin is warm and dry. No rash noted. Not diaphoretic. No erythema. No pallor. NEUROLOGIC: Alert and oriented to person, place, and time. Normal reflexes, muscle tone coordination. No cranial nerve deficit noted. PSYCHIATRIC: Normal mood and affect. Normal behavior. Normal judgment and thought  content. CARDIOVASCULAR: Normal heart rate noted, regular rhythm. 2+ distal pulses. RESPIRATORY: Effort and breath sounds normal, no problems with respiration noted. BREASTS: Symmetric in size. No masses, skin changes, nipple drainage, or lymphadenopathy. ABDOMEN: Soft,  no distention noted.  No tenderness, rebound or guarding.  PELVIC: Normal appearing external genitalia; normal appearing vaginal mucosa and cervix.  No abnormal discharge noted.  Pap smear obtained.  Normal uterine size, no other palpable masses, no uterine or adnexal tenderness. MUSCULOSKELETAL: Normal range of motion.    Assessment and Plan:  1) Annual gynecologic examination with pap smear:  Will follow up results of pap smear and manage accordingly.  Routine preventative health maintenance measures emphasized. Reviewed perimenopausal symptoms and management.   2) Contraception counseling: Likes OCP, desires to continue. No contraindications.  1. Well woman exam with routine gynecological exam - Cytology - PAP  2. Menorrhagia with irregular cycle - norethindrone-ethinyl estradiol 1/35 (ALAYCEN 1/35) tablet; TAKE 1 TABLET BY MOUTH DAILY. MAY SKIP PLACEBOS  Dispense: 84 tablet; Refill: 4  Please refer to After Visit Summary for other counseling recommendations.   Return in about 1 year (around 09/23/2023) for Yearly wellness exam.  09/25/2023, MD, MPH, ABFM Attending Physician Center for [facility]

## 2022-09-22 NOTE — Progress Notes (Signed)
Refill bc

## 2022-09-28 LAB — CYTOLOGY - PAP
Chlamydia: NEGATIVE
Comment: NEGATIVE
Comment: NORMAL
Diagnosis: NEGATIVE
Neisseria Gonorrhea: NEGATIVE

## 2023-08-10 ENCOUNTER — Other Ambulatory Visit: Payer: Self-pay | Admitting: *Deleted

## 2023-08-10 DIAGNOSIS — N921 Excessive and frequent menstruation with irregular cycle: Secondary | ICD-10-CM

## 2023-08-10 MED ORDER — ALYACEN 1/35 1-35 MG-MCG PO TABS: ORAL_TABLET | ORAL | 4 refills | Status: AC

## 2023-11-05 ENCOUNTER — Telehealth (INDEPENDENT_AMBULATORY_CARE_PROVIDER_SITE_OTHER): Payer: Self-pay | Admitting: Obstetrics and Gynecology

## 2023-11-05 DIAGNOSIS — N921 Excessive and frequent menstruation with irregular cycle: Secondary | ICD-10-CM

## 2023-11-05 DIAGNOSIS — Z3009 Encounter for other general counseling and advice on contraception: Secondary | ICD-10-CM

## 2023-11-05 NOTE — Progress Notes (Signed)
   TELEHEALTH GYNECOLOGY VISIT ENCOUNTER NOTE  Provider location: Center for [facility]   Patient location: Work  I connected with [name] on 11/05/23 at 1:45pm via MyChart audiovisual encounter   History:  Rebecca Singleton is a 28 y.o. G0P0000 presenting for discussion of breakthrough bleeding.   Has been on COC x 2 years. It is working well to help with her severe periods and took them away completely. Had episode of heavy breakthrough bleeding recently. Bleed heavily for 1.5 weeks and was changing super plus tampons every 20 minutes. Had missed a pill and took one the next day. Has missed doses in the past but has never had heavy bleeding like this before. She also notes more fatigue, lower sex drive, and weight gain is is wondering if birth control could be contributing.   Has same sex partner, no possibility of pregnancy.     Past Medical History:  Diagnosis Date   Anxiety    Depression    Kidney stones    Restless leg syndrome    Past Surgical History:  Procedure Laterality Date   NO PAST SURGERIES     The following portions of the patient's history were reviewed and updated as appropriate: allergies, current medications, past family history, past medical history, past social history, past surgical history and problem list.   Health Maintenance:  Normal pap on 09/22/22   Review of Systems:  Pertinent items noted in HPI and remainder of comprehensive ROS otherwise negative.  Physical Exam:   General:  Alert, oriented and cooperative.   Mental Status: Normal mood and affect perceived. Normal judgment and thought content.  Physical exam deferred due to nature of the encounter  Labs and Imaging No results found for this or any previous visit (from the past 2 weeks). No results found.    Assessment and Plan:  27yo with breakthrough bleeding on COCs  Breakthrough bleeding on birth control pills Encounter for general  counseling and advice on contraceptive management No possibility of pregnancy (SS partner) Discussed most likely etiology of bleeding is the missed/late dose of OCP but will rule out thyroid disease given systemic symptoms and assess for any structural causes for this episode Offered changing birth control pill type or method but she is otherwise happy with how this one is working and will continue for now. -     CBC; Future -     TSH Rfx on Abnormal to Free T4; Future -     US PELVIC COMPLETE WITH TRANSVAGINAL; Future  I provided 17 minutes of non-face-to-face time during this encounter.  Lennart Pall, MD Center for [employer], [facility]

## 2023-11-07 ENCOUNTER — Ambulatory Visit: Payer: Self-pay

## 2024-02-04 DIAGNOSIS — B354 Tinea corporis: Secondary | ICD-10-CM | POA: Diagnosis not present

## 2024-02-04 DIAGNOSIS — Z1389 Encounter for screening for other disorder: Secondary | ICD-10-CM | POA: Diagnosis not present

## 2024-02-04 DIAGNOSIS — Z1331 Encounter for screening for depression: Secondary | ICD-10-CM | POA: Diagnosis not present

## 2024-02-04 DIAGNOSIS — R29818 Other symptoms and signs involving the nervous system: Secondary | ICD-10-CM | POA: Diagnosis not present

## 2024-02-08 DIAGNOSIS — Z5181 Encounter for therapeutic drug level monitoring: Secondary | ICD-10-CM | POA: Diagnosis not present

## 2024-02-08 DIAGNOSIS — Z79899 Other long term (current) drug therapy: Secondary | ICD-10-CM | POA: Diagnosis not present

## 2024-02-11 DIAGNOSIS — Z0000 Encounter for general adult medical examination without abnormal findings: Secondary | ICD-10-CM | POA: Diagnosis not present

## 2024-02-13 DIAGNOSIS — R41844 Frontal lobe and executive function deficit: Secondary | ICD-10-CM | POA: Diagnosis not present

## 2024-02-16 DIAGNOSIS — E66813 Obesity, class 3: Secondary | ICD-10-CM | POA: Diagnosis not present

## 2024-02-16 DIAGNOSIS — E7800 Pure hypercholesterolemia, unspecified: Secondary | ICD-10-CM | POA: Diagnosis not present

## 2024-02-16 DIAGNOSIS — Z6841 Body Mass Index (BMI) 40.0 and over, adult: Secondary | ICD-10-CM | POA: Diagnosis not present

## 2024-02-19 DIAGNOSIS — G43109 Migraine with aura, not intractable, without status migrainosus: Secondary | ICD-10-CM | POA: Diagnosis not present

## 2024-02-19 DIAGNOSIS — G44209 Tension-type headache, unspecified, not intractable: Secondary | ICD-10-CM | POA: Diagnosis not present

## 2024-03-11 DIAGNOSIS — R41844 Frontal lobe and executive function deficit: Secondary | ICD-10-CM | POA: Diagnosis not present

## 2024-04-11 DIAGNOSIS — R41844 Frontal lobe and executive function deficit: Secondary | ICD-10-CM | POA: Diagnosis not present

## 2024-07-19 ENCOUNTER — Other Ambulatory Visit: Payer: Self-pay | Admitting: Family Medicine

## 2024-07-19 DIAGNOSIS — N921 Excessive and frequent menstruation with irregular cycle: Secondary | ICD-10-CM
